# Patient Record
Sex: Female | Born: 1995 | Race: Black or African American | Hispanic: No | Marital: Single | Smoking: Never smoker
Health system: Southern US, Community
[De-identification: ages and names within clinical notes are randomized; demographics above are authoritative.]

## PROBLEM LIST (undated history)

## (undated) HISTORY — PX: KNEE ARTHROSCOPY WITH ANTERIOR CRUCIATE LIGAMENT (ACL) REPAIR: SHX5644

---
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
## 2017-07-24 ENCOUNTER — Other Ambulatory Visit: Payer: Self-pay | Admitting: Physician Assistant

## 2017-07-24 DIAGNOSIS — M79605 Pain in left leg: Secondary | ICD-10-CM

## 2017-07-25 ENCOUNTER — Ambulatory Visit (HOSPITAL_COMMUNITY)
Admission: RE | Admit: 2017-07-25 | Discharge: 2017-07-25 | Disposition: A | Discharge status: Home / Self Care | Payer: 59 | Source: Ambulatory Visit | Attending: Physician Assistant | Admitting: Physician Assistant

## 2017-07-25 DIAGNOSIS — M79605 Pain in left leg: Secondary | ICD-10-CM

## 2017-07-25 DIAGNOSIS — M7989 Other specified soft tissue disorders: Secondary | ICD-10-CM | POA: Diagnosis present
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
## 2019-01-08 ENCOUNTER — Other Ambulatory Visit: Payer: Self-pay | Admitting: Orthopaedic Surgery

## 2019-01-08 DIAGNOSIS — M79605 Pain in left leg: Secondary | ICD-10-CM

## 2019-01-08 DIAGNOSIS — S83512A Sprain of anterior cruciate ligament of left knee, initial encounter: Secondary | ICD-10-CM

## 2019-01-15 ENCOUNTER — Ambulatory Visit
Admission: RE | Admit: 2019-01-15 | Discharge: 2019-01-15 | Disposition: A | Discharge status: Home / Self Care | Payer: 59 | Source: Ambulatory Visit | Attending: Orthopaedic Surgery | Admitting: Orthopaedic Surgery

## 2019-01-15 DIAGNOSIS — S83512A Sprain of anterior cruciate ligament of left knee, initial encounter: Secondary | ICD-10-CM

## 2019-01-15 DIAGNOSIS — M79605 Pain in left leg: Secondary | ICD-10-CM
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
## 2019-03-11 ENCOUNTER — Other Ambulatory Visit (HOSPITAL_COMMUNITY): Payer: 59

## 2019-03-14 ENCOUNTER — Encounter (HOSPITAL_BASED_OUTPATIENT_CLINIC_OR_DEPARTMENT_OTHER): Payer: Self-pay

## 2019-03-14 ENCOUNTER — Ambulatory Visit (HOSPITAL_BASED_OUTPATIENT_CLINIC_OR_DEPARTMENT_OTHER): Admit: 2019-03-14 | Payer: 59 | Admitting: Orthopaedic Surgery

## 2019-03-14 SURGERY — KNEE ARTHROSCOPY WITH ANTERIOR CRUCIATE LIGAMENT (ACL) REPAIR
Anesthesia: Choice | Site: Knee | Laterality: Left

## 2019-05-01 DIAGNOSIS — Z8616 Personal history of COVID-19: Secondary | ICD-10-CM

## 2019-05-01 HISTORY — DX: Personal history of COVID-19: Z86.16

## 2019-05-23 ENCOUNTER — Other Ambulatory Visit: Payer: Self-pay

## 2019-05-23 ENCOUNTER — Encounter (HOSPITAL_BASED_OUTPATIENT_CLINIC_OR_DEPARTMENT_OTHER): Payer: Self-pay | Admitting: Orthopaedic Surgery
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
## 2019-05-29 NOTE — Progress Notes (Signed)

## 2019-05-29 NOTE — Anesthesia Preprocedure Evaluation (Addendum)
Anesthesia Evaluation  Patient identified by MRN, date of birth, ID band Patient awake    Reviewed: Allergy & Precautions, H&P , NPO status , Patient's Chart, lab work & pertinent test results  Airway Mallampati: I  TM Distance: >3 FB Neck ROM: Full    Dental no notable dental hx. (+) Teeth Intact, Dental Advisory Given   Pulmonary neg pulmonary ROS,    Pulmonary exam normal breath sounds clear to auscultation       Cardiovascular Exercise Tolerance: Good negative cardio ROS Normal cardiovascular exam Rhythm:Regular Rate:Normal     Neuro/Psych negative neurological ROS  negative psych ROS   GI/Hepatic negative GI ROS, Neg liver ROS,   Endo/Other  negative endocrine ROS  Renal/GU negative Renal ROS  negative genitourinary   Musculoskeletal negative musculoskeletal ROS (+)   Abdominal   Peds negative pediatric ROS (+)  Hematology negative hematology ROS (+)   Anesthesia Other Findings   Reproductive/Obstetrics negative OB ROS                            Anesthesia Physical Anesthesia Plan  ASA: II  Anesthesia Plan: General   Post-op Pain Management: GA combined w/ Regional for post-op pain   Induction:   PONV Risk Score and Plan: 3 and Midazolam, Ondansetron, Dexamethasone and Treatment may vary due to age or medical condition  Airway Management Planned: Oral ETT and LMA  Additional Equipment:   Intra-op Plan:   Post-operative Plan: Extubation in OR  Informed Consent: I have reviewed the patients History and Physical, chart, labs and discussed the procedure including the risks, benefits and alternatives for the proposed anesthesia with the patient or authorized representative who has indicated his/her understanding and acceptance.     Dental advisory given  Plan Discussed with: Anesthesiologist, CRNA and Surgeon  Anesthesia Plan Comments: (Discussed both nerve block for  pain relief post-op and GA; including NV, sore throat, dental injury, and pulmonary complications)       Anesthesia Quick Evaluation

## 2019-05-30 ENCOUNTER — Ambulatory Visit (HOSPITAL_BASED_OUTPATIENT_CLINIC_OR_DEPARTMENT_OTHER)
Admission: RE | Admit: 2019-05-30 | Discharge: 2019-05-30 | Disposition: A | Discharge status: Home / Self Care | Payer: Managed Care, Other (non HMO) | Attending: Orthopaedic Surgery | Admitting: Orthopaedic Surgery

## 2019-05-30 ENCOUNTER — Other Ambulatory Visit: Payer: Self-pay

## 2019-05-30 ENCOUNTER — Ambulatory Visit (HOSPITAL_BASED_OUTPATIENT_CLINIC_OR_DEPARTMENT_OTHER): Payer: Managed Care, Other (non HMO) | Admitting: Anesthesiology

## 2019-05-30 ENCOUNTER — Encounter (HOSPITAL_BASED_OUTPATIENT_CLINIC_OR_DEPARTMENT_OTHER)
Admission: RE | Disposition: A | Discharge status: Home / Self Care | Payer: Self-pay | Source: Home / Self Care | Attending: Orthopaedic Surgery

## 2019-05-30 ENCOUNTER — Encounter (HOSPITAL_BASED_OUTPATIENT_CLINIC_OR_DEPARTMENT_OTHER): Payer: Self-pay | Admitting: Orthopaedic Surgery

## 2019-05-30 DIAGNOSIS — Z8616 Personal history of COVID-19: Secondary | ICD-10-CM | POA: Diagnosis not present

## 2019-05-30 DIAGNOSIS — M2352 Chronic instability of knee, left knee: Secondary | ICD-10-CM | POA: Insufficient documentation

## 2019-05-30 DIAGNOSIS — M795 Residual foreign body in soft tissue: Secondary | ICD-10-CM | POA: Diagnosis not present

## 2019-05-30 HISTORY — PX: KNEE ARTHROSCOPY WITH ANTERIOR CRUCIATE LIGAMENT (ACL) REPAIR: SHX5644

## 2019-05-30 HISTORY — PX: FOREIGN BODY REMOVAL: SHX962

## 2019-05-30 LAB — POCT PREGNANCY, URINE: Preg Test, Ur: NEGATIVE

## 2019-05-30 SURGERY — KNEE ARTHROSCOPY WITH ANTERIOR CRUCIATE LIGAMENT (ACL) REPAIR
Anesthesia: General | Site: Leg Lower | Laterality: Left

## 2019-05-30 MED ORDER — SODIUM CHLORIDE 0.9 % IR SOLN: Status: DC | PRN

## 2019-05-30 MED ORDER — LIDOCAINE 2% (20 MG/ML) 5 ML SYRINGE
INTRAMUSCULAR | Status: DC | PRN
  Administered 2019-05-30: 80 mg via INTRAVENOUS

## 2019-05-30 MED ORDER — ONDANSETRON HCL 4 MG/2ML IJ SOLN: 4.0000 mg | Freq: Once | INTRAMUSCULAR | Status: DC | PRN

## 2019-05-30 MED ORDER — CHLORHEXIDINE GLUCONATE 4 % EX LIQD: 60.0000 mL | Freq: Once | CUTANEOUS | Status: DC

## 2019-05-30 MED ORDER — ACETAMINOPHEN 500 MG PO TABS: 1000.0000 mg | ORAL_TABLET | Freq: Three times a day (TID) | ORAL | 0 refills | Status: AC

## 2019-05-30 MED ORDER — DEXAMETHASONE SODIUM PHOSPHATE 10 MG/ML IJ SOLN
INTRAMUSCULAR | Status: DC | PRN
  Administered 2019-05-30: 4 mg via INTRAVENOUS
  Administered 2019-05-30: 10 mg via INTRAVENOUS

## 2019-05-30 MED ORDER — MIDAZOLAM HCL 2 MG/2ML IJ SOLN
1.0000 mg | INTRAMUSCULAR | Status: DC | PRN
  Administered 2019-05-30: 2 mg via INTRAVENOUS

## 2019-05-30 MED ORDER — OXYCODONE HCL 5 MG/5ML PO SOLN: 5.0000 mg | Freq: Once | ORAL | Status: DC | PRN

## 2019-05-30 MED ORDER — OXYCODONE HCL 5 MG PO TABS: ORAL_TABLET | ORAL | 0 refills | Status: AC

## 2019-05-30 MED ORDER — ACETAMINOPHEN 10 MG/ML IV SOLN
1000.0000 mg | Freq: Once | INTRAVENOUS | Status: AC
  Administered 2019-05-30: 1000 mg via INTRAVENOUS

## 2019-05-30 MED ORDER — FENTANYL CITRATE (PF) 250 MCG/5ML IJ SOLN
INTRAMUSCULAR | Status: DC | PRN
  Administered 2019-05-30 (×4): 25 ug via INTRAVENOUS

## 2019-05-30 MED ORDER — PROPOFOL 10 MG/ML IV BOLUS
INTRAVENOUS | Status: DC | PRN
  Administered 2019-05-30: 200 mg via INTRAVENOUS

## 2019-05-30 MED ORDER — ACETAMINOPHEN 160 MG/5ML PO SOLN: 325.0000 mg | ORAL | Status: DC | PRN

## 2019-05-30 MED ORDER — ROPIVACAINE HCL 7.5 MG/ML IJ SOLN
INTRAMUSCULAR | Status: DC | PRN
  Administered 2019-05-30: 30 mL via PERINEURAL

## 2019-05-30 MED ORDER — MELOXICAM 7.5 MG PO TABS: 7.5000 mg | ORAL_TABLET | Freq: Every day | ORAL | 2 refills | Status: AC

## 2019-05-30 MED ORDER — FENTANYL CITRATE (PF) 100 MCG/2ML IJ SOLN
INTRAMUSCULAR | Status: AC
  Filled 2019-05-30: qty 2

## 2019-05-30 MED ORDER — PHENYLEPHRINE 40 MCG/ML (10ML) SYRINGE FOR IV PUSH (FOR BLOOD PRESSURE SUPPORT)
PREFILLED_SYRINGE | INTRAVENOUS | Status: DC | PRN
  Administered 2019-05-30 (×2): 80 ug via INTRAVENOUS

## 2019-05-30 MED ORDER — ACETAMINOPHEN 325 MG PO TABS: 325.0000 mg | ORAL_TABLET | ORAL | Status: DC | PRN

## 2019-05-30 MED ORDER — MEPERIDINE HCL 25 MG/ML IJ SOLN: 6.2500 mg | INTRAMUSCULAR | Status: DC | PRN

## 2019-05-30 MED ORDER — FENTANYL CITRATE (PF) 100 MCG/2ML IJ SOLN
50.0000 ug | INTRAMUSCULAR | Status: DC | PRN
  Administered 2019-05-30: 07:00:00 100 ug via INTRAVENOUS

## 2019-05-30 MED ORDER — DEXAMETHASONE SODIUM PHOSPHATE 10 MG/ML IJ SOLN
INTRAMUSCULAR | Status: AC
  Filled 2019-05-30: qty 1

## 2019-05-30 MED ORDER — CEFAZOLIN SODIUM-DEXTROSE 2-4 GM/100ML-% IV SOLN
2.0000 g | INTRAVENOUS | Status: AC
  Administered 2019-05-30: 08:00:00 2 g via INTRAVENOUS

## 2019-05-30 MED ORDER — ONDANSETRON HCL 4 MG/2ML IJ SOLN
INTRAMUSCULAR | Status: DC | PRN
  Administered 2019-05-30: 4 mg via INTRAVENOUS

## 2019-05-30 MED ORDER — DEXMEDETOMIDINE HCL IN NACL 200 MCG/50ML IV SOLN
INTRAVENOUS | Status: DC | PRN
  Administered 2019-05-30 (×5): 4 ug via INTRAVENOUS

## 2019-05-30 MED ORDER — CEFAZOLIN SODIUM-DEXTROSE 2-4 GM/100ML-% IV SOLN
INTRAVENOUS | Status: AC
  Filled 2019-05-30: qty 100

## 2019-05-30 MED ORDER — OXYCODONE HCL 5 MG PO TABS: 5.0000 mg | ORAL_TABLET | Freq: Once | ORAL | Status: DC | PRN

## 2019-05-30 MED ORDER — LACTATED RINGERS IV SOLN: INTRAVENOUS | Status: DC

## 2019-05-30 MED ORDER — ACETAMINOPHEN 10 MG/ML IV SOLN
INTRAVENOUS | Status: AC
  Filled 2019-05-30: qty 100

## 2019-05-30 MED ORDER — VANCOMYCIN HCL 1000 MG IV SOLR
INTRAVENOUS | Status: DC | PRN
  Administered 2019-05-30: 1000 mg

## 2019-05-30 MED ORDER — EPINEPHRINE PF 1 MG/ML IJ SOLN
INTRAMUSCULAR | Status: AC
  Filled 2019-05-30: qty 8

## 2019-05-30 MED ORDER — ONDANSETRON HCL 4 MG PO TABS: 4.0000 mg | ORAL_TABLET | Freq: Three times a day (TID) | ORAL | 1 refills | Status: AC | PRN

## 2019-05-30 MED ORDER — ASPIRIN 81 MG PO CHEW: 81.0000 mg | CHEWABLE_TABLET | Freq: Two times a day (BID) | ORAL | 1 refills | Status: AC

## 2019-05-30 MED ORDER — MIDAZOLAM HCL 2 MG/2ML IJ SOLN
INTRAMUSCULAR | Status: AC
  Filled 2019-05-30: qty 2

## 2019-05-30 MED ORDER — PHENYLEPHRINE 40 MCG/ML (10ML) SYRINGE FOR IV PUSH (FOR BLOOD PRESSURE SUPPORT)
PREFILLED_SYRINGE | INTRAVENOUS | Status: AC
  Filled 2019-05-30: qty 10

## 2019-05-30 MED ORDER — FENTANYL CITRATE (PF) 100 MCG/2ML IJ SOLN
25.0000 ug | INTRAMUSCULAR | Status: DC | PRN
  Administered 2019-05-30 (×3): 50 ug via INTRAVENOUS

## 2019-05-30 MED ORDER — PROPOFOL 10 MG/ML IV BOLUS
INTRAVENOUS | Status: AC
  Filled 2019-05-30: qty 20

## 2019-05-30 SURGICAL SUPPLY — 116 items
ANCHOR BUTTON TIGHTROPE ACL RT (Orthopedic Implant) ×4 IMPLANT
BLADE AVERAGE 25MMX9MM (BLADE)
BLADE AVERAGE 25X9 (BLADE) IMPLANT
BLADE SHAVER BONE 5.0MM X 13CM (MISCELLANEOUS) ×1
BLADE SHAVER BONE 5.0X13 (MISCELLANEOUS) ×3 IMPLANT
BLADE SURG 10 STRL SS (BLADE) ×4 IMPLANT
BLADE SURG 15 STRL LF DISP TIS (BLADE) ×4 IMPLANT
BLADE SURG 15 STRL SS (BLADE) ×4
BLADE SURG SZ10 CARB STEEL (BLADE) IMPLANT
BNDG COHESIVE 4X5 TAN STRL (GAUZE/BANDAGES/DRESSINGS) IMPLANT
BNDG ELASTIC 6X5.8 VLCR STR LF (GAUZE/BANDAGES/DRESSINGS) ×8 IMPLANT
BONE TUNNEL PLUG CANNULATED (MISCELLANEOUS) ×4 IMPLANT
BURR OVAL 8 FLU 4.0MM X 13CM (MISCELLANEOUS)
BURR OVAL 8 FLU 4.0X13 (MISCELLANEOUS) IMPLANT
BUTTON SWITCH PENCIL HOLSTER (MISCELLANEOUS) IMPLANT
CHLORAPREP W/TINT 26 (MISCELLANEOUS) ×4 IMPLANT
CLOSURE WOUND 1/2 X4 (GAUZE/BANDAGES/DRESSINGS) ×2
COVER BACK TABLE 60X90IN (DRAPES) ×4 IMPLANT
COVER WAND RF STERILE (DRAPES) IMPLANT
CUFF TOURN SGL QUICK 34 (TOURNIQUET CUFF) ×2
CUFF TRNQT CYL 34X4.125X (TOURNIQUET CUFF) ×2 IMPLANT
CUTTER TENSIONER SUT 2-0 0 FBW (INSTRUMENTS) ×4 IMPLANT
DECANTER SPIKE VIAL GLASS SM (MISCELLANEOUS) IMPLANT
DISSECTOR 3.5MM X 13CM CVD (MISCELLANEOUS) ×4 IMPLANT
DISSECTOR 4.0MMX13CM CVD (MISCELLANEOUS) IMPLANT
DRAIN PENROSE 18X1/4 LTX STRL (WOUND CARE) IMPLANT
DRAPE ARTHROSCOPY W/POUCH 114 (DRAPES) IMPLANT
DRAPE ARTHROSCOPY W/POUCH 90 (DRAPES) ×4 IMPLANT
DRAPE HALF SHEET 70X43 (DRAPES) ×4 IMPLANT
DRAPE IMP U-DRAPE 54X76 (DRAPES) ×4 IMPLANT
DRAPE OEC MINIVIEW 54X84 (DRAPES) ×8 IMPLANT
DRAPE SHEET LG 3/4 BI-LAMINATE (DRAPES) ×4 IMPLANT
DRAPE TOP ARMCOVERS (MISCELLANEOUS) ×4 IMPLANT
DRAPE U-SHAPE 47X51 STRL (DRAPES) ×4 IMPLANT
DRILL FLIPCUTTER III 6-12 (ORTHOPEDIC DISPOSABLE SUPPLIES) ×2 IMPLANT
ELECT BLADE 4.0 EZ CLEAN MEGAD (MISCELLANEOUS) ×4
ELECT REM PT RETURN 15FT ADLT (MISCELLANEOUS) IMPLANT
ELECT REM PT RETURN 9FT ADLT (ELECTROSURGICAL) ×8
ELECTRODE BLDE 4.0 EZ CLN MEGD (MISCELLANEOUS) ×2 IMPLANT
ELECTRODE REM PT RTRN 9FT ADLT (ELECTROSURGICAL) ×4 IMPLANT
FIBERSTICK 2 (SUTURE) IMPLANT
FLIPCUTTER III 6-12 AR-1204FF (ORTHOPEDIC DISPOSABLE SUPPLIES) ×4
GAUZE SPONGE 4X4 12PLY STRL (GAUZE/BANDAGES/DRESSINGS) ×8 IMPLANT
GLOVE BIO SURGEON STRL SZ 6.5 (GLOVE) ×3 IMPLANT
GLOVE BIO SURGEON STRL SZ7.5 (GLOVE) ×4 IMPLANT
GLOVE BIO SURGEON STRL SZ8 (GLOVE) ×4 IMPLANT
GLOVE BIO SURGEONS STRL SZ 6.5 (GLOVE) ×1
GLOVE BIOGEL PI IND STRL 6.5 (GLOVE) ×2 IMPLANT
GLOVE BIOGEL PI IND STRL 7.0 (GLOVE) ×4 IMPLANT
GLOVE BIOGEL PI IND STRL 7.5 (GLOVE) ×2 IMPLANT
GLOVE BIOGEL PI IND STRL 8 (GLOVE) ×4 IMPLANT
GLOVE BIOGEL PI INDICATOR 6.5 (GLOVE) ×2
GLOVE BIOGEL PI INDICATOR 7.0 (GLOVE) ×4
GLOVE BIOGEL PI INDICATOR 7.5 (GLOVE) ×2
GLOVE BIOGEL PI INDICATOR 8 (GLOVE) ×4
GLOVE ECLIPSE 6.5 STRL STRAW (GLOVE) ×4 IMPLANT
GLOVE ECLIPSE 8.0 STRL XLNG CF (GLOVE) ×4 IMPLANT
GOWN STRL REUS W/ TWL LRG LVL3 (GOWN DISPOSABLE) ×10 IMPLANT
GOWN STRL REUS W/ TWL XL LVL3 (GOWN DISPOSABLE) IMPLANT
GOWN STRL REUS W/TWL LRG LVL3 (GOWN DISPOSABLE) ×10
GOWN STRL REUS W/TWL XL LVL3 (GOWN DISPOSABLE) ×8 IMPLANT
GUIDEPIN FLEX PATHFINDER 2.4MM (WIRE) ×4 IMPLANT
IMMOBILIZER KNEE 20 (SOFTGOODS)
IMMOBILIZER KNEE 20 THIGH 36 (SOFTGOODS) IMPLANT
IMMOBILIZER KNEE 22 UNIV (SOFTGOODS) ×4 IMPLANT
IV NS IRRIG 3000ML ARTHROMATIC (IV SOLUTION) ×16 IMPLANT
K-WIRE .062X4 (WIRE) ×4 IMPLANT
K-WIRE 9  SMOOTH .062 (WIRE) ×4 IMPLANT
KIT BASIN OR (CUSTOM PROCEDURE TRAY) IMPLANT
KIT LEG STABILIZATION (KITS) IMPLANT
KIT TRANSTIBIAL (DISPOSABLE) ×4 IMPLANT
KNEE WRAP E Z 3 GEL PACK (MISCELLANEOUS) ×4 IMPLANT
MANIFOLD NEPTUNE II (INSTRUMENTS) ×4 IMPLANT
NDL SAFETY ECLIPSE 18X1.5 (NEEDLE) ×2 IMPLANT
NEEDLE HYPO 18GX1.5 SHARP (NEEDLE) ×2
NS IRRIG 1000ML POUR BTL (IV SOLUTION) ×4 IMPLANT
PACK ARTHROSCOPY DSU (CUSTOM PROCEDURE TRAY) ×4 IMPLANT
PACK BASIN DAY SURGERY FS (CUSTOM PROCEDURE TRAY) ×4 IMPLANT
PACK ICE MAXI GEL EZY WRAP (MISCELLANEOUS) IMPLANT
PAD CAST 4YDX4 CTTN HI CHSV (CAST SUPPLIES) ×2 IMPLANT
PADDING CAST COTTON 4X4 STRL (CAST SUPPLIES) ×2
PENCIL SMOKE EVACUATOR (MISCELLANEOUS) ×4 IMPLANT
PORT APPOLLO RF 90DEGREE MULTI (SURGICAL WAND) ×4 IMPLANT
REAMER FLEX GUIDE PIN 9.5MM (DRILL) ×2 IMPLANT
REAMER/FLEX GUIDE PIN 9.5MM (DRILL) ×4
SCREW FT BIOCOMP 9X30 (Screw) ×4 IMPLANT
SLEEVE SCD COMPRESS KNEE MED (MISCELLANEOUS) ×4 IMPLANT
SPONGE LAP 18X18 RF (DISPOSABLE) ×4 IMPLANT
SPONGE LAP 4X18 RFD (DISPOSABLE) ×4 IMPLANT
STRIP CLOSURE SKIN 1/2X4 (GAUZE/BANDAGES/DRESSINGS) ×6 IMPLANT
SUCTION FRAZIER HANDLE 10FR (MISCELLANEOUS) ×2
SUCTION TUBE FRAZIER 10FR DISP (MISCELLANEOUS) ×2 IMPLANT
SUT 0 FIBERLOOP 38 BLUE TPR ND (SUTURE) ×4
SUT FIBERWIRE #2 38 T-5 BLUE (SUTURE)
SUT MNCRL AB 3-0 PS2 27 (SUTURE) ×4 IMPLANT
SUT MNCRL AB 4-0 PS2 18 (SUTURE) ×4 IMPLANT
SUT VIC AB 0 CT1 27 (SUTURE) ×2
SUT VIC AB 0 CT1 27XBRD ANBCTR (SUTURE) IMPLANT
SUT VIC AB 0 CT1 27XCR 8 STRN (SUTURE) ×2 IMPLANT
SUT VIC AB 1 CT1 27 (SUTURE) ×4
SUT VIC AB 1 CT1 27XBRD ANBCTR (SUTURE) ×4 IMPLANT
SUT VIC AB 1 CT1 27XBRD ANTBC (SUTURE) IMPLANT
SUT VIC AB 2-0 CT2 27 (SUTURE) IMPLANT
SUT VIC AB 2-0 SH 27 (SUTURE)
SUT VIC AB 2-0 SH 27XBRD (SUTURE) IMPLANT
SUTURE 0 FIBERLP 38 BLU TPR ND (SUTURE) ×2 IMPLANT
SUTURE FIBERWR #2 38 T-5 BLUE (SUTURE) IMPLANT
SUTURE TAPE 1.3 FIBERLOP 20 ST (SUTURE) ×8 IMPLANT
SUTURETAPE 1.3 FIBERLOOP 20 ST (SUTURE) ×16
SYR 3ML LL SCALE MARK (SYRINGE) IMPLANT
TOWEL GREEN STERILE FF (TOWEL DISPOSABLE) ×8 IMPLANT
TOWEL OR 17X26 10 PK STRL BLUE (TOWEL DISPOSABLE) IMPLANT
TUBE SUCTION HIGH CAP CLEAR NV (SUCTIONS) ×4 IMPLANT
TUBING ARTHROSCOPY IRRIG 16FT (MISCELLANEOUS) ×4 IMPLANT
TUBING CONNECTING 10 (TUBING) IMPLANT
TUBING CONNECTING 10' (TUBING)

## 2019-05-30 NOTE — Anesthesia Postprocedure Evaluation (Signed)
Anesthesia Post Note  Patient: Terri Morrow  Procedure(s) Performed: KNEE ARTHROSCOPY WITH ANTERIOR CRUCIATE LIGAMENT (ACL) REPAIR WITH HAMSTRING AUTOGRAFT (Left Knee) REMOVAL FOREIGN BODY LEFT THIGH/KNEE TIMES TWO (Left Leg Lower) REMOVAL FOREIGN BODY EXTREMITY (Left Leg Lower)     Patient location during evaluation: PACU Anesthesia Type: General Level of consciousness: awake and alert Pain management: pain level controlled Vital Signs Assessment: post-procedure vital signs reviewed and stable Respiratory status: spontaneous breathing, nonlabored ventilation, respiratory function stable and patient connected to nasal cannula oxygen Cardiovascular status: blood pressure returned to baseline and stable Postop Assessment: no apparent nausea or vomiting Anesthetic complications: no    Last Vitals:  Vitals:   05/30/19 1113 05/30/19 1210  BP:  137/83  Pulse: 90 (!) 107  Resp: 13 20  Temp:  36.7 C  SpO2: 99% 100%    Last Pain:  Vitals:   05/30/19 1210  TempSrc: Axillary  PainSc: 4                  Dejay Kronk
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
## 2019-05-30 NOTE — Discharge Instructions (Signed)
No Tylenol before 4:45pm  Post Anesthesia Home Care Instructions  Activity: Get plenty of rest for the remainder of the day. A responsible individual must stay with you for 24 hours following the procedure.  For the next 24 hours, DO NOT: -Drive a car -Advertising copywriter -Drink alcoholic beverages -Take any medication unless instructed by your physician -Make any legal decisions or sign important papers.  Meals: Start with liquid foods such as gelatin or soup. Progress to regular foods as tolerated. Avoid greasy, spicy, heavy foods. If nausea and/or vomiting occur, drink only clear liquids until the nausea and/or vomiting subsides. Call your physician if vomiting continues.  Special Instructions/Symptoms: Your throat may feel dry or sore from the anesthesia or the breathing tube placed in your throat during surgery. If this causes discomfort, gargle with warm salt water. The discomfort should disappear within 24 hours.  If you had a scopolamine patch placed behind your ear for the management of post- operative nausea and/or vomiting:  1. The medication in the patch is effective for 72 hours, after which it should be removed.  Wrap patch in a tissue and discard in the trash. Wash hands thoroughly with soap and water. 2. You may remove the patch earlier than 72 hours if you experience unpleasant side effects which may include dry mouth, dizziness or visual disturbances. 3. Avoid touching the patch. Wash your hands with soap and water after contact with the patch.     Regional Anesthesia Blocks  1. Numbness or the inability to move the "blocked" extremity may last from 3-48 hours after placement. The length of time depends on the medication injected and your individual response to the medication. If the numbness is not going away after 48 hours, call your surgeon.  2. The extremity that is blocked will need to be protected until the numbness is gone and the  Strength has returned. Because  you cannot feel it, you will need to take extra care to avoid injury. Because it may be weak, you may have difficulty moving it or using it. You may not know what position it is in without looking at it while the block is in effect.  3. For blocks in the legs and feet, returning to weight bearing and walking needs to be done carefully. You will need to wait until the numbness is entirely gone and the strength has returned. You should be able to move your leg and foot normally before you try and bear weight or walk. You will need someone to be with you when you first try to ensure you do not fall and possibly risk injury.  4. Bruising and tenderness at the needle site are common side effects and will resolve in a few days.  5. Persistent numbness or new problems with movement should be communicated to the surgeon or the [facility] [phone] [facility] [phone]).

## 2019-05-30 NOTE — Anesthesia Procedure Notes (Signed)
Anesthesia Regional Block: Femoral nerve block   Pre-Anesthetic Checklist: ,, timeout performed, Correct Patient, Correct Site, Correct Laterality, Correct Procedure, Correct Position, site marked, Risks and benefits discussed,  Surgical consent,  Pre-op evaluation,  At surgeon's request and post-op pain management  Laterality: Left  Prep: chloraprep       Needles:  Injection technique: Single-shot  Needle Type: Echogenic Stimulator Needle     Needle Length: 5cm  Needle Gauge: 22     Additional Needles:   Procedures:, nerve stimulator,,, ultrasound used (permanent image in chart),,,,   Nerve Stimulator or Paresthesia:  Response: quadraceps contraction, 0.45 mA,   Additional Responses:   Narrative:  Start time: 05/30/2019 7:14 AM End time: 05/30/2019 7:21 AM Injection made incrementally with aspirations every 5 mL.  Performed by: Personally  Anesthesiologist: Bethena Midget, MD  Additional Notes: Functioning IV was confirmed and monitors were applied.  A 70mm 22ga Arrow echogenic stimulator needle was used. Sterile prep and drape,hand hygiene and sterile gloves were used. Ultrasound guidance: relevant anatomy identified, needle position confirmed, local anesthetic spread visualized around nerve(s)., vascular puncture avoided.  Image printed for medical record. Negative aspiration and negative test dose prior to incremental administration of local anesthetic. The patient tolerated the procedure well.

## 2019-05-30 NOTE — Anesthesia Procedure Notes (Signed)
Procedure Name: LMA Insertion Date/Time: 05/30/2019 7:37 AM Performed by: Lucinda Dell, CRNA Pre-anesthesia Checklist: Patient identified, Emergency Drugs available, Suction available and Patient being monitored Patient Re-evaluated:Patient Re-evaluated prior to induction Oxygen Delivery Method: Circle system utilized Preoxygenation: Pre-oxygenation with 100% oxygen Induction Type: IV induction Ventilation: Mask ventilation without difficulty LMA: LMA inserted LMA Size: 4.0 Tube type: Oral Number of attempts: 1 Placement Confirmation: positive ETCO2 and breath sounds checked- equal and bilateral Tube secured with: Tape Dental Injury: Teeth and Oropharynx as per pre-operative assessment

## 2019-05-30 NOTE — Progress Notes (Signed)
AssistedDr. Oddono with left, ultrasound guided, femoral block. Side rails up, monitors on throughout procedure. See vital signs in flow sheet. Tolerated Procedure well.  
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
## 2019-05-30 NOTE — Transfer of Care (Signed)
Immediate Anesthesia Transfer of Care Note  Patient: Terri Morrow  Procedure(s) Performed: KNEE ARTHROSCOPY WITH ANTERIOR CRUCIATE LIGAMENT (ACL) REPAIR WITH HAMSTRING AUTOGRAFT (Left Knee) REMOVAL FOREIGN BODY LEFT THIGH/KNEE TIMES TWO (Left Leg Lower) REMOVAL FOREIGN BODY EXTREMITY (Left Leg Lower)  Patient Location: PACU  Anesthesia Type:GA combined with regional for post-op pain  Level of Consciousness: sedated, patient cooperative and responds to stimulation  Airway & Oxygen Therapy: Patient Spontanous Breathing and Patient connected to nasal cannula oxygen  Post-op Assessment: Report given to RN, Post -op Vital signs reviewed and stable and Patient moving all extremities  Post vital signs: Reviewed and stable  Last Vitals:  Vitals Value Taken Time  BP    Temp    Pulse    Resp    SpO2      Last Pain: There were no vitals filed for this visit.       Complications: No apparent anesthesia complications

## 2019-06-03 ENCOUNTER — Encounter: Payer: Self-pay | Admitting: *Deleted

## 2019-08-15 ENCOUNTER — Ambulatory Visit: Payer: 59 | Attending: Family

## 2019-08-15 DIAGNOSIS — Z23 Encounter for immunization: Secondary | ICD-10-CM

## 2019-08-15 NOTE — Progress Notes (Signed)
   Covid-19 Vaccination Clinic  Name:  Terri Morrow    MRN: [id number] DOB: 09/14/1995  08/15/2019  Terri Morrow was observed post Covid-19 immunization for 15 minutes without incident. She was provided with Vaccine Information Sheet and instruction to access the V-Safe system.   Terri Morrow was instructed to call 911 with any severe reactions post vaccine: Marland Kitchen Difficulty breathing  . Swelling of face and throat  . A fast heartbeat  . A bad rash all over body  . Dizziness and weakness   Immunizations Administered    Name Date Dose VIS Date Route   Moderna COVID-19 Vaccine 08/15/2019  1:47 PM 0.5 mL 04/16/2019 Intramuscular   Manufacturer: Moderna   Lot: [id number]   NDC: 64403-474-25

## 2019-09-17 ENCOUNTER — Ambulatory Visit: Payer: 59 | Attending: Family

## 2019-09-17 DIAGNOSIS — Z23 Encounter for immunization: Secondary | ICD-10-CM

## 2019-09-17 NOTE — Progress Notes (Signed)
   Covid-19 Vaccination Clinic  Name:  Ashlyne Olenick    MRN: [id number] DOB: [birth year]  09/17/2019  Ms. Hollingshed was observed post Covid-19 immunization for 15 minutes without incident. She was provided with Vaccine Information Sheet and instruction to access the V-Safe system.   Ms. Sobieski was instructed to call 911 with any severe reactions post vaccine: Marland Kitchen Difficulty breathing  . Swelling of face and throat  . A fast heartbeat  . A bad rash all over body  . Dizziness and weakness   Immunizations Administered    Name Date Dose VIS Date Route   Moderna COVID-19 Vaccine 09/17/2019 10:10 AM 0.5 mL 04/2019 Intramuscular   Manufacturer: Moderna   Lot: [id number]   NDC: 54862-824-17
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
